# Patient Record
Sex: Female | Born: 1990 | Race: Black or African American | Hispanic: No | Marital: Married | Smoking: Never smoker
Health system: Southern US, Community
[De-identification: ages and names within clinical notes are randomized; demographics above are authoritative.]

---

## 2020-01-21 ENCOUNTER — Encounter (HOSPITAL_COMMUNITY): Payer: Self-pay | Admitting: Obstetrics and Gynecology

## 2020-01-21 ENCOUNTER — Inpatient Hospital Stay (HOSPITAL_COMMUNITY)
Admission: AD | Admit: 2020-01-21 | Discharge: 2020-01-21 | Disposition: A | Discharge status: Home / Self Care | Payer: Medicaid Other | Attending: Obstetrics and Gynecology | Admitting: Obstetrics and Gynecology

## 2020-01-21 ENCOUNTER — Inpatient Hospital Stay (HOSPITAL_BASED_OUTPATIENT_CLINIC_OR_DEPARTMENT_OTHER): Payer: Medicaid Other

## 2020-01-21 DIAGNOSIS — N760 Acute vaginitis: Secondary | ICD-10-CM | POA: Diagnosis not present

## 2020-01-21 DIAGNOSIS — R109 Unspecified abdominal pain: Secondary | ICD-10-CM

## 2020-01-21 DIAGNOSIS — Z3A17 17 weeks gestation of pregnancy: Secondary | ICD-10-CM

## 2020-01-21 DIAGNOSIS — O23592 Infection of other part of genital tract in pregnancy, second trimester: Secondary | ICD-10-CM | POA: Diagnosis not present

## 2020-01-21 DIAGNOSIS — B9689 Other specified bacterial agents as the cause of diseases classified elsewhere: Secondary | ICD-10-CM

## 2020-01-21 DIAGNOSIS — O26892 Other specified pregnancy related conditions, second trimester: Secondary | ICD-10-CM

## 2020-01-21 DIAGNOSIS — O26899 Other specified pregnancy related conditions, unspecified trimester: Secondary | ICD-10-CM

## 2020-01-21 DIAGNOSIS — R102 Pelvic and perineal pain: Secondary | ICD-10-CM | POA: Insufficient documentation

## 2020-01-21 DIAGNOSIS — O99891 Other specified diseases and conditions complicating pregnancy: Secondary | ICD-10-CM | POA: Diagnosis not present

## 2020-01-21 LAB — WET PREP, GENITAL
Sperm: NONE SEEN
Trich, Wet Prep: NONE SEEN
Yeast Wet Prep HPF POC: NONE SEEN

## 2020-01-21 LAB — GC/CHLAMYDIA PROBE AMP (~~LOC~~) NOT AT ARMC
Chlamydia: NEGATIVE
Comment: NEGATIVE
Comment: NORMAL
Neisseria Gonorrhea: NEGATIVE

## 2020-01-21 MED ORDER — ACETAMINOPHEN 500 MG PO TABS
1000.0000 mg | ORAL_TABLET | Freq: Once | ORAL | Status: AC
  Administered 2020-01-21: 1000 mg via ORAL
  Filled 2020-01-21: qty 2

## 2020-01-21 MED ORDER — METRONIDAZOLE 500 MG PO TABS
500.0000 mg | ORAL_TABLET | Freq: Two times a day (BID) | ORAL | 0 refills | Status: AC
Start: 2020-01-21 — End: ?

## 2020-01-21 MED ORDER — CYCLOBENZAPRINE HCL 5 MG PO TABS
10.0000 mg | ORAL_TABLET | Freq: Once | ORAL | Status: AC
  Administered 2020-01-21: 10 mg via ORAL
  Filled 2020-01-21: qty 2

## 2020-01-21 NOTE — MAU Note (Addendum)
Pt presents to MAU complaining of  Constant Right abdominal pain that began around 10pm last night. No vaginal bleeding or LOF. Pt states she is visiting from [place] and began prenatal care there.   Pain: 8/10

## 2020-01-21 NOTE — MAU Provider Note (Signed)
History     CSN: [id number]  Arrival date and time: 01/21/20 0115   First Provider Initiated Contact with Patient 01/21/20 0152      Chief Complaint  Patient presents with  . Abdominal Pain   Kristina Mcbride is a 29 y.o. G1P0 at [redacted]w[redacted]d who presents to MAU with complaints of abdominal pain. Patient receives prenatal care in [place] and is here visiting when abdominal pain started. Patient reports abdominal pain started around 2200 last night. She describes the abdominal pain as "a pain that she can not explain" that is constant that gets worse with walking. She reports that pain gets better when laying down. Rates pain 8/10 - has not taken any medication for abdominal pain. She denies vaginal bleeding or discharge. +FM.    OB History    Gravida  1   Para      Term      Preterm      AB      Living        SAB      TAB      Ectopic      Multiple      Live Births              History reviewed. No pertinent past medical history.  History reviewed. No pertinent surgical history.  History reviewed. No pertinent family history.  Social History   Tobacco Use  . Smoking status: Never Smoker  . Smokeless tobacco: Never Used  Substance Use Topics  . Alcohol use: Never  . Drug use: Never    Allergies: Not on File  Medications Prior to Admission  Medication Sig Dispense Refill Last Dose  . Prenatal Vit-Fe Fumarate-FA (MULTIVITAMIN-PRENATAL) 27-0.8 MG TABS tablet Take 1 tablet by mouth daily at 12 noon.   01/20/2020 at Unknown time    Review of Systems  Constitutional: Negative.   Respiratory: Negative.   Cardiovascular: Negative.   Gastrointestinal: Positive for abdominal pain. Negative for constipation, diarrhea, nausea and vomiting.  Genitourinary: Negative.   Musculoskeletal: Negative.   Neurological: Negative.   Psychiatric/Behavioral: Negative.    Physical Exam   Blood pressure 119/82, pulse 97, temperature 98.3 F (36.8 C), temperature source Oral,  resp. rate 15, height 4' 11.06" (1.5 m), weight 59.1 kg, last menstrual period 09/20/2019, SpO2 100 %.  Physical Exam  Nursing note and vitals reviewed. Constitutional: She is oriented to person, place, and time. She appears well-developed and well-nourished. No distress.  HENT:  Head: Normocephalic.  Cardiovascular: Normal rate and regular rhythm.  Respiratory: Effort normal and breath sounds normal. No respiratory distress. She has no wheezes.  GI: Soft. She exhibits no distension. There is no abdominal tenderness. There is no rebound and no guarding.  Musculoskeletal:        General: No edema. Normal range of motion.  Neurological: She is alert and oriented to person, place, and time.  Psychiatric: She has a normal mood and affect. Her behavior is normal. Thought content normal.   Dilation: Closed Effacement (%): Thick Exam by:: Darrol Poke, CNM  FHR 151 by doppler   MAU Course  Procedures  MDM Wet prep  GC/C  Tylenol 1,000mg    [place] interpreter used throughout visit in MAU   Reassessment @ 0300, patient reports that pain is still 8/10 but it is "better" [place] and Flexeril ordered   Labs reviewed:  Results for orders placed or performed during the hospital encounter of 01/21/20 (from the past 24 hour(s))  Wet prep, genital  Status: Abnormal   Collection Time: 01/21/20  2:04 AM  Result Value Ref Range   Yeast Wet Prep HPF POC NONE SEEN NONE SEEN   Trich, Wet Prep NONE SEEN NONE SEEN   Clue Cells Wet Prep HPF POC PRESENT (A) NONE SEEN   WBC, Wet Prep HPF POC MODERATE (A) NONE SEEN   Sperm NONE SEEN    Patient returned from [place] @ 0430, [place] results pending  Reassessment of patient's pain after [place] and flexeril. Patient reports that pain is now 3/10.   [place] report reviewed: FHR 174, presentation: transverse head to maternal right (where patient's pain is located), posterior placenta with marginal insertion, AFV WNL, CL 3.18cm.   Discussed results of [place] and labs with  patient. Educated and discussed use of Tylenol PRN and maternity support belt for round ligament pain. Discussed need for treatment of BV, Rx for treatment sent to pharmacy of choice based on patient's current location while visiting.  Discussed reasons to return to MAU. Return to MAU as needed. Pt stable at time of discharge.   Assessment and Plan   1. Pain of round ligament during pregnancy   2. Abdominal pain during pregnancy   3. [redacted] weeks gestation of pregnancy   4. Bacterial vaginosis    Discharge home Return to MAU as needed for reasons discussed and/or emergencies  Maternity support belt and Tylenol PRN  Rx for flagyl sent to pharmacy  Follow-up Information    [facility] Follow up.   Specialty: Obstetrics and Gynecology Why: Retour a` MAU au besoin pour les urgences  Contact information: [address] [id number] [place] [address] [phone]         Allergies as of 01/21/2020   Not on File     Medication List    TAKE these medications   metroNIDAZOLE 500 MG tablet Commonly known as: FLAGYL Take 1 tablet (500 mg total) by mouth 2 (two) times daily.   multivitamin-prenatal 27-0.8 MG Tabs tablet Take 1 tablet by mouth daily at 12 noon.       Sharyon Cable CNM 01/21/2020, 5:14 AM

## 2020-01-21 NOTE — Discharge Instructions (Signed)
   PREGNANCY SUPPORT BELT: You are not alone, Seventy-five percent of women have some sort of abdominal or back pain at some point in their pregnancy. Your baby is growing at a fast pace, which means that your whole body is rapidly trying to adjust to the changes. As your uterus grows, your back may start feeling a bit under stress and this can result in back or abdominal pain that can go from mild, and therefore bearable, to severe pains that will not allow you to sit or lay down comfortably, When it comes to dealing with pregnancy-related pains and cramps, some pregnant women usually prefer natural remedies, which the market is filled with nowadays. For example, wearing a pregnancy support belt can help ease and lessen your discomfort and pain. WHAT ARE THE BENEFITS OF WEARING A PREGNANCY SUPPORT BELT? A pregnancy support belt provides support to the lower portion of the belly taking some of the weight of the growing uterus and distributing to the other parts of your body. It is designed make you comfortable and gives you extra support. Over the years, the pregnancy apparel market has been studying the needs and wants of pregnant women and they have come up with the most comfortable pregnancy support belts that woman could ever ask for. In fact, you will no longer have to wear a stretched-out or bulky pregnancy belt that is visible underneath your clothes and makes you feel even more uncomfortable. Nowadays, a pregnancy support belt is made of comfortable and stretchy materials that will not irritate your skin but will actually make you feel at ease and you will not even notice you are wearing it. They are easy to put on and adjust during the day and can be worn at night for additional support.  BENEFITS: . Relives Back pain . Relieves Abdominal Muscle and Leg Pain . Stabilizes the Pelvic Ring . Offers a Cushioned Abdominal Lift Pad . Relieves pressure on the Sciatic Nerve Within Minutes  Peut commander  a[employer], [employer] ou [employer]
